# Patient Record
Sex: Male | Born: 1940 | Race: White | Hispanic: No | Marital: Married
Health system: Southern US, Community
[De-identification: ages and names within clinical notes are randomized; demographics above are authoritative.]

---

## 2007-06-01 ENCOUNTER — Inpatient Hospital Stay (HOSPITAL_COMMUNITY): Admission: RE | Admit: 2007-06-01 | Discharge: 2007-06-05 | Payer: Self-pay | Admitting: Orthopedic Surgery

## 2008-06-13 IMAGING — CR DG PORTABLE PELVIS
1 series · 1 of 1 positions shown · non-contrast
Comparison: none

CLINICAL DATA: The patient has undergone hip replacement.
 PORTABLE PELVIS ? 1 VIEW:

[view not recorded]
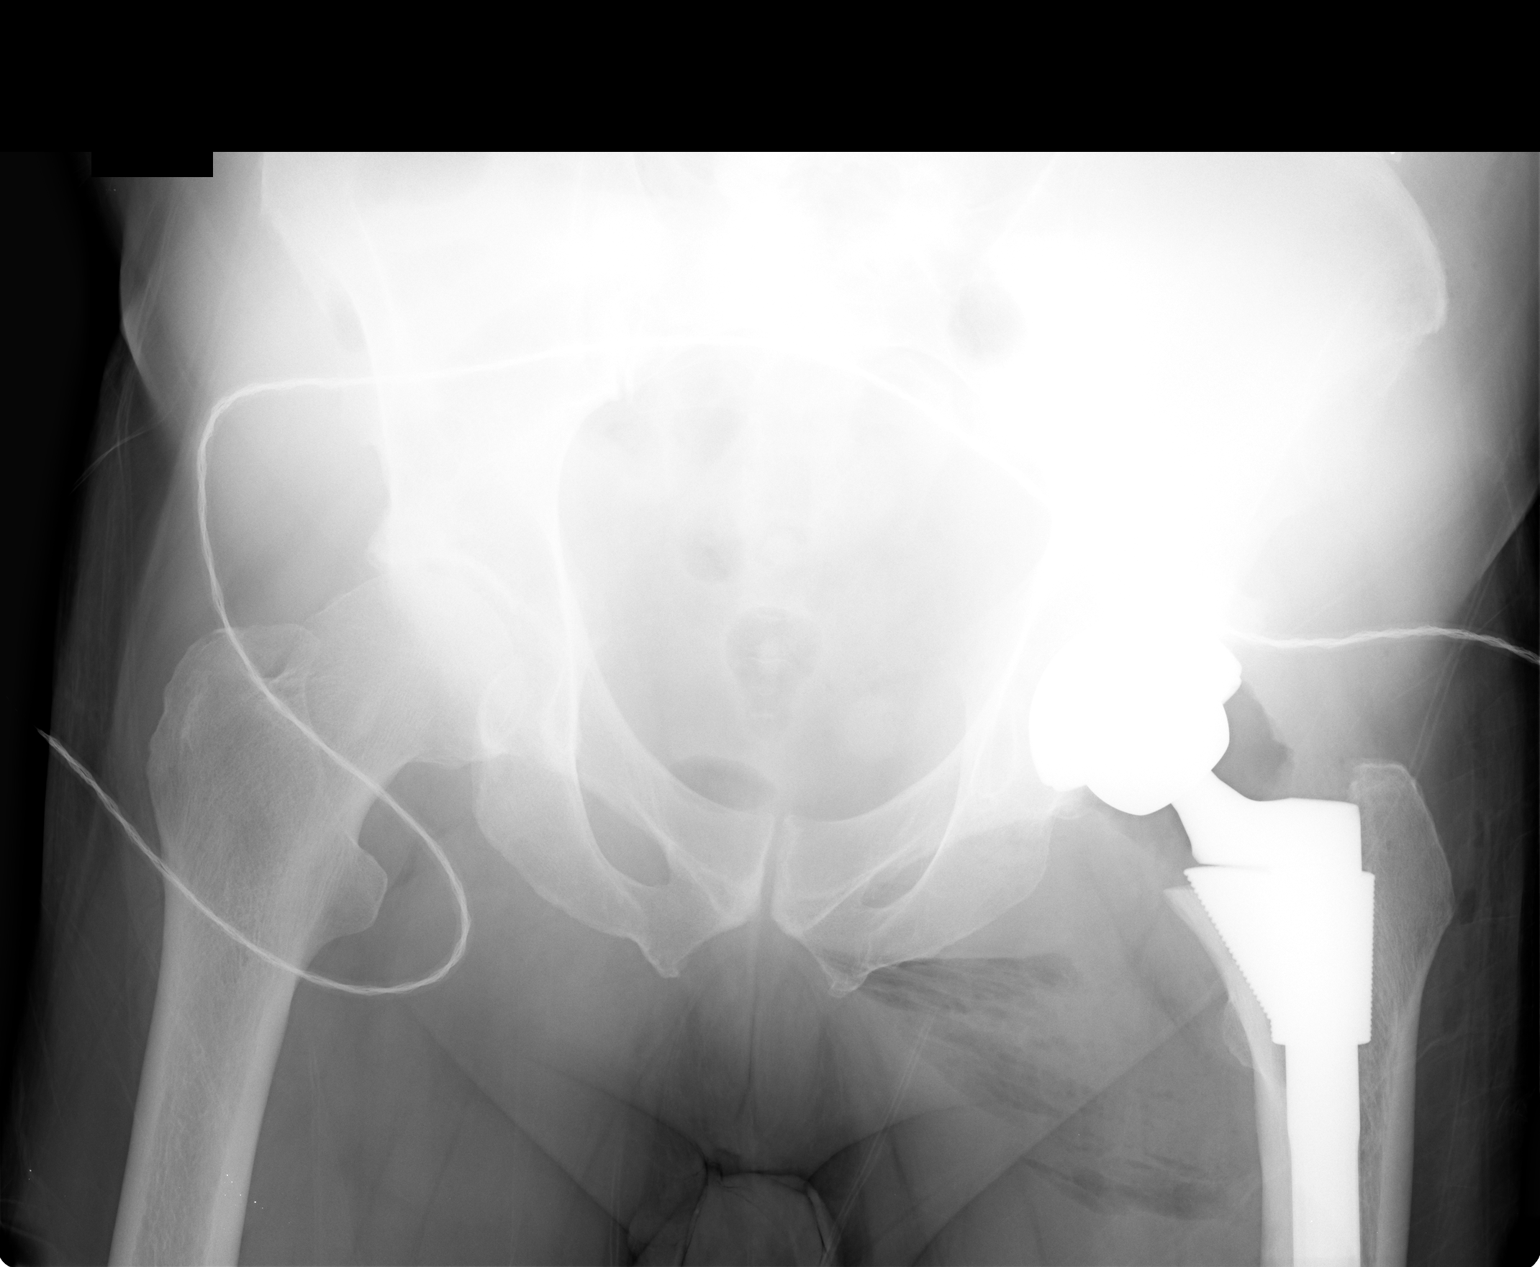

[1 of 1 positions shown; findings below may reference images not displayed]

FINDINGS: Left hip prosthesis is well positioned and has a normal postoperative appearance.  There is extensive soft tissue air within the medial left thigh and left hemipelvis.  No underlying osseous changes.  Degenerative changes seen about the right hip.
IMPRESSION: 1. Extensive soft tissue air in medial left pelvis.
 2. Total hip replacement ? normal.

## 2008-06-13 IMAGING — CR DG HIP 1V PORT*L*
1 series · 1 of 1 positions shown · non-contrast
Comparison: none

CLINICAL DATA: OA.  Hip replacement.
 PORTABLE LEFT HIP ? 1 VIEW:

[view not recorded]
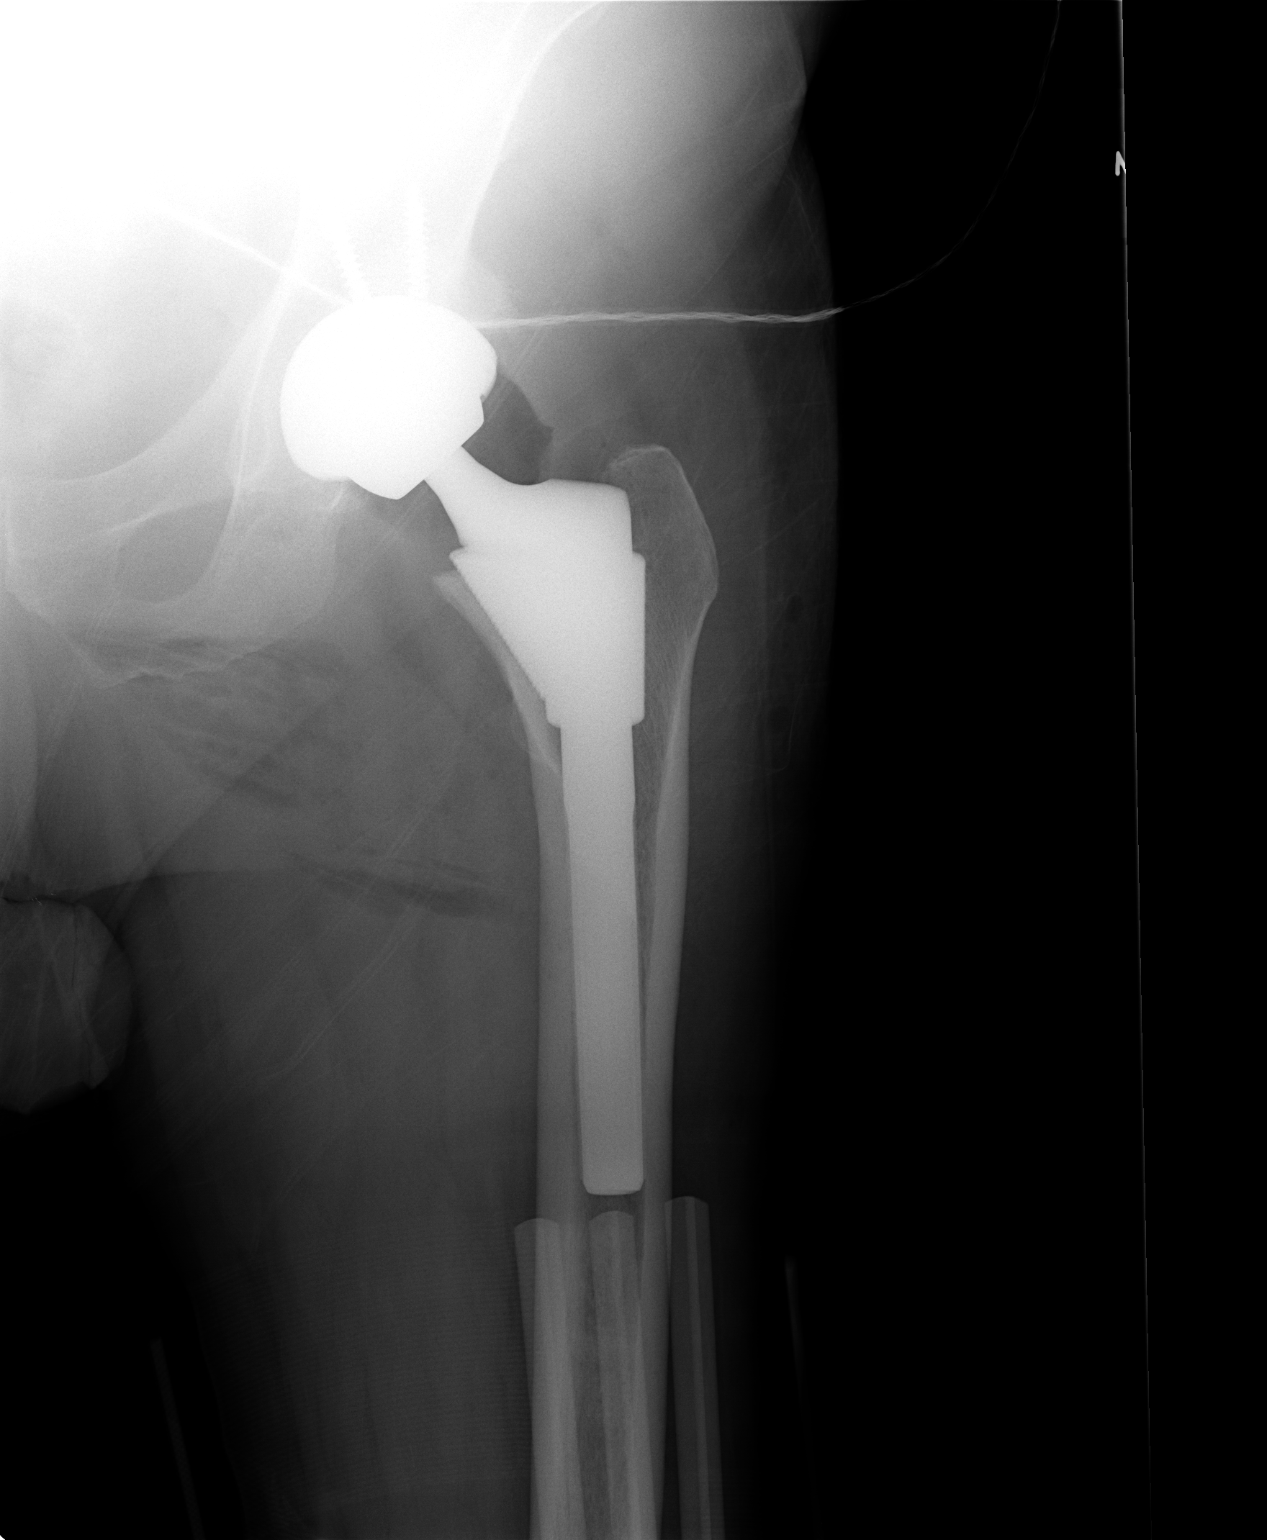

[1 of 1 positions shown; findings below may reference images not displayed]

FINDINGS: Acetabular and femoral components well positioned. Soft tissue air is seen over the medial left thigh. No perioperative complication.
IMPRESSION: Left total hip replacement normal postop appearance.

## 2009-08-22 ENCOUNTER — Encounter: Admission: RE | Admit: 2009-08-22 | Discharge: 2009-08-22 | Payer: Self-pay | Admitting: Orthopedic Surgery

## 2009-09-06 ENCOUNTER — Ambulatory Visit (HOSPITAL_BASED_OUTPATIENT_CLINIC_OR_DEPARTMENT_OTHER): Admission: RE | Admit: 2009-09-06 | Discharge: 2009-09-06 | Payer: Self-pay | Admitting: Orthopedic Surgery

## 2011-03-15 LAB — POCT I-STAT 4, (NA,K, GLUC, HGB,HCT): Glucose, Bld: 93 mg/dL (ref 70–99)

## 2011-04-23 NOTE — Op Note (Signed)
NAMESARGENT, MANKEY NO.:  [id number]   MEDICAL RECORD NO.:  [id number]          PATIENT TYPE:  INP   LOCATION:  0004                         FACILITY:  [facility]   PHYSICIAN:  Ollen Gross, M.D.    DATE OF BIRTH:  1941/11/29   DATE OF PROCEDURE:  06/01/2007  DATE OF DISCHARGE:                               OPERATIVE REPORT   PREOPERATIVE DIAGNOSIS:  Osteoarthritis left hip.   POSTOPERATIVE DIAGNOSIS:  Osteoarthritis left hip.   PROCEDURE:  Left total hip arthroplasty.   SURGEON:  Ollen Gross, M.D.   ASSISTANT:  Avel Peace PA-C.   ANESTHESIA:  General.   ESTIMATED BLOOD LOSS:  400.   DRAINS:  None.   COMPLICATIONS:  None.   CONDITION:  Stable to recovery.   CLINICAL NOTE:  Mr. Scheidt is a 70 year old male end-stage arthritis  left hip with progressively worsening pain and dysfunction.  He has  failed nonoperative management and presents for total hip arthroplasty.   PROCEDURE IN DETAIL:  After the successful administration of general  anesthetic, the patient is placed in the right lateral decubitus  position with the left side up and held with the hip positioner.  Left  lower extremity was isolated from his perineum with plastic drapes and  prepped and draped in the usual sterile fashion.  Short posterolateral  incision is made with a 10 blade through the subcutaneous tissue to the  level of the fascia lata which is incised in line with the skin  incision.  Sciatic nerve is palpated and protected and a short rotator  is isolated off the femur.  Capsulectomy is performed and the hip is  dislocated.  The center of the femoral head is marked and a trial  prosthesis placed such that the center of trial head corresponds to the  center of his native femoral head.  Osteotomy is  marked on the femoral  neck and osteotomy made with an oscillating saw.  Femoral head is  removed and the femur retracted anteriorly to gain acetabular exposure.   Acetabular  retractors are placed and the labrum and osteophytes removed.  Reaming starts at 49 mm coursing increments of 2 up to 57 mm, then a 58  mm pinnacle acetabular shell is placed in anatomic position and  transfixed with two dome screws.  The apex hole eliminator is placed and  the permanent 40 mm neutral Ultamet metal liner is placed for a metal-on-  metal hip replacement.   He was prepared with the canal finder and irrigation.  Axial reaming is  performed to 17.5 mm, proximal reaming to a 84F and the sleeve machine  to an extra extra large.  A 84F extra extra large sleeve is placed with  a 22 x 17 stem, 36 plus 12 neck about 10 degrees beyond his native  anteversion.  A 40 plus 0 head is placed and the hip reduces easily.  We  needed to go to a +6 head to get more appropriate tension.  With the +6  head we had excellent stability with full extension, full external  rotation, 70  degrees flexion, 40 degrees adduction and 70 degrees  internal rotation and 90 degrees of flexion, 70 degrees of internal  rotation.  By placing the left leg on top of the right, I felt as though  the leg lengths were equal.  The hip is then dislocated.  Trials  removed.  The permanent 56F extra extra large sleeve is placed with a 22  x 17 stem with 36 plus 12 neck 10 degrees beyond native anteversion.  The 40 plus 6 head is placed and the hips reduced to the same stability  parameters.  Wound is copiously irrigated with saline solution and short  rotators reattached to the femur through drill holes.  Fascia lata is  closed over a Hemovac drain with interrupted #1 Vicryl.  Subcu is closed  with #1 and 2-0 Vicryl and subcuticular running 4-0 Monocryl.  Incision  is cleaned and dried and Steri-Strips and bulky sterile dressing  applied.  He was then placed into a knee immobilizer, awakened and  transported to recovery in stable condition.      Ollen Gross, M.D.  Electronically Signed     FA/MEDQ  D:   06/01/2007  T:  06/01/2007  Job:  [id number]

## 2011-04-23 NOTE — Discharge Summary (Signed)
Daniel [name], Daniel [name] NO.:  [id number]   MEDICAL RECORD NO.:  [id number]          PATIENT TYPE:  INP   LOCATION:  1532                         FACILITY:  [facility]   PHYSICIAN:  Ollen Gross, M.D.    DATE OF BIRTH:  1941/01/04   DATE OF ADMISSION:  06/01/2007  DATE OF DISCHARGE:  06/05/2007                               DISCHARGE SUMMARY   ADMISSION DIAGNOSES:  1. Osteoarthritis, left hip.  2. Atrial fibrillation.  3. Hypertension.  4. History of deep vein thromboses.  5. History of pulmonary embolism.  6. Hypercholesterolemia.  7. Hiatal hernia.  8. Reflux.  9. Past history of ulcers.  10.History of renal calculi.  11.Hypothyroidism.   DISCHARGE DIAGNOSES:  1. Osteoarthritis, left hip, status post left total hip replacement      arthroplasty.  2. Mild postoperative blood loss anemia.  Did not require transfusion.  3. Mild postoperative hypotension, asymptomatic.  4..  Atrial fibrillation.  1. Hypertension.  2. History of deep vein thromboses.  3. History of pulmonary embolism.  4. Hypercholesterolemia.  5. Hiatal hernia.  6. Reflux.  7. Past history of ulcers.  8. History of renal calculi.  9. Hypothyroidism.   PROCEDURE:  June 01, 2007, left total hip.  Surgeon:  Dr. Lequita Halt.  Assistant:  Avel Peace, PA-C.  Anesthesia:  General.   CONSULTATIONS:  None.   BRIEF HISTORY:  Mr. Garraway is a 70 year old male with end-stage  arthritis of the left hip, progressive worsening pain and dysfunction,  who failed nonoperative management and now presents for a total hip  arthroplasty.   LABORATORY DATA:  Preop CBC showed a hemoglobin of 15.6, hematocrit of  46.4, a white cell count 7.7.  postop hemoglobin of 12.9, drifted down  to 12.7, last noted H&H 12.2 and 35.5.  PT/PTT on admission 24.4 and 32,  respectively, INR 2.1.  This was done preoperatively when he was on  Coumadin.  Repeated the morning of surgery and INR was down to normal  level of 1.0.   Put back on his Coumadin.  Serial pro times followed.  Last noted PT/INR 22.8 and 1.9.  Chemistry panel on admission all within  normal limits.  Serial  BMETs were followed.  Electrolytes remained within normal limits.  Preop  UA was negative.  Blood group type AB positive.   X-RAYS:  Two-view chest May 25, 2007:  Mild cardiomegaly without acute  disease.  Left hip films preop May 25, 2007:  Severe left and moderate  right hip osteoarthritis.  Postop hip and pelvis film:  Satisfactory,  normal-appearing left total hip.  EKG May 25, 2007:  atrial  fibrillation, rightward axis, nonspecific T-wave abnormalities, no  previous tracings to compare, confirmed by Dr. Arvilla Meres.   HOSPITAL COURSE:  The patient was admitted to [facility],  tolerated the procedure well, later transferred to the recovery room,  then to the orthopedic floor.  Started on PCA and p.o. analgesic for  pain control following surgery.  Started back on his Coumadin, which he  was on preoperatively.  He was also given a  Lovenox bridge protocol  until his INR became therapeutic.  He was placed on 25-50%  weightbearing.  He was actually doing pretty well on the morning of day  #1.  He did have some pain and discomfort but a lot of the deep pain,  preop pain, had improved.  Hemoglobin was 12.9.  He had very good output  from a urinary standpoint, started getting up out of bed.  By day #2 he  had had a little bit of discomfort in his left hand.  This is where his  IV was so that was discontinued.  On the evening of day one, however,  his pressure got a little low down to 97 systolic.  We held his Benicar,  which he was on daily.  Pressures were about 120 and he even had low  pressures again postop day #2, around 94 systolic when he was up and  about.  Dressing was changed on day #2, incision looked good.  We  rechecked his pressures after a little bit of fluid challenge.  His  pressures came back up in the  120s.  From a therapy standpoint he  actually did very well.  He was getting up walking, about 50-60 feet.  He continued to progress well.  He was weaned over p.o. medications.  PCA was had been in discontinued on day #1.  Progressing with his  therapy day #2 and day #3.  He had a little bit of a blister on the back  of his, leg which was likely due to the knee immobilizer which was used  for 24 hours postop.  That was covered with a little Tegaderm.  It was  okay.  By day #3 he continued to progress with therapy.  His INR was  improving but not quite there, he was 1.8.  He continued his Lovenox.  On day #3 pressures were doing a little bit better, ranging from about  94 to 129 systolic, and this was off his Benicar.  By day #4 on June 05, 2007, the patient was doing very well.  Pressure was 129/79 that  morning, no complaints, and was ready to go home.   DISCHARGE PLAN:  The patient is discharged home on June 05, 2007.   DISCHARGE DIAGNOSES:  Please see above.   DISCHARGE MEDICATIONS:  1. Coumadin protocol for DVT prophylaxis for 3 weeks but after the end      of the 3 weeks, he will go back on his own protocol.  2. Percocet 5 mg one or two every 4-6 hours as needed for pain.  3. Robaxin 500 mg p.o. q.6-8h. p.r.n. spasm.  4. He is going to resume his home medications with the exception of      his Benicar.  The Benicar is on hold at this time because of the      hypotension.  His Plavix is currently on hold.  It is of note, when      he completes the 3 weeks of Coumadin for our DVT prophylaxis, the      following day he will start back on his normal regimen of Coumadin      which was preop, and he will also resume his Plavix at that time.   FOLLOW-UP:  The patient is going to follow up in the office the week of  July 7, will call for an appointment, to follow up one day that week  with the PA, Drew.  ACTIVITY:  He is  partial weightbearing to the left lower extremity, hip   precautions, total hip protocol.  Will start some home health PT and  home health RN for Coumadin and blood draws.   FURTHER FOLLOW-UP:  Due to currently holding his Benicar at time of  discharge and also the blood pressure issues, we are going to have him  follow back up with his medical physician sometime next week, Dr. Valentina Lucks.  A copy of this discharge summary will be sent to Dr. Valentina Lucks after  transcription, fax number below.   DISPOSITION:  Home.   CONDITION ON DISCHARGE:  Improving.      Alexzandrew L. Perkins, P.A.C.      Ollen Gross, M.D.  Electronically Signed    ALP/MEDQ  D:  06/05/2007  T:  06/05/2007  Job:  [id number]   cc:   Fortunato Curling, MD  [facility]  [address]  [place], [place] [address]  Valinda Hoar [phone]

## 2011-04-26 NOTE — H&P (Signed)
NAMEOLANDER, FRIEDL NO.:  [id number]   MEDICAL RECORD NO.:  [id number]          PATIENT TYPE:  INP   LOCATION:  NA                           FACILITY:  [facility]   PHYSICIAN:  Ollen Gross, M.D.    DATE OF BIRTH:  Sep 08, 1941   DATE OF ADMISSION:  06/01/2007  DATE OF DISCHARGE:                              HISTORY & PHYSICAL   CHIEF COMPLAINT:  Left hip pain.   HISTORY OF PRESENT ILLNESS:  The patient is a 70 year old male, has been  seen by Dr. Lequita Halt for ongoing left hip pain that has been ongoing for  about a year and a half now, has progressively gotten worse with time,  started out in the left thigh and groin area, has progressively gotten  worse and interfered with his mobility.  His family is here in  [place], and he came in for a second opinion with Dr. Lequita Halt.  He  has been seen and found to have severe endstage arthritis in the left  hip with bone on bone and some erosion of the superolateral acetabulum.  It is felt he would best be served by undergoing hip replacement.  Risks  and benefits have been discussed.  He elected to proceed with surgery.   ALLERGIES:  NO KNOWN DRUG ALLERGIES.   INTOLERANCES:  MORPHINE AND CODEINE BOTH CAUSE NAUSEA.   CURRENT MEDICATIONS:  1. Lanoxin 0.25 mg.  2. Benicar 40 mg.  3. Plavix 75 mg.  4. Synthroid 75 mg.  5. Crestor 10 mg.  6. Protonix 40 mg.  7. Coumadin 7.5 mg and 5 mg alternating.   PAST MEDICAL HISTORY:  Deep vein thrombosis, pulmonary embolism,  hypercholesterolemia, hypertension, hiatal hernia, reflux disease, past  history of ulcers, history of renal calculi, history of renal calculi,  hypothyroidism, atrial fibrillation.   PAST SURGICAL HISTORY:  He has undergone an arthroscopy and also  colonoscopy.   SOCIAL HISTORY:  Married, retired, nonsmoker, no alcohol, one child.   FAMILY HISTORY:  Mother with history of arthritis, heart disease,  hypertension with a pacemaker placement, father  decreased with history  of diabetes.   REVIEW OF SYSTEMS:  GENERAL:  No fever, chills, night sweats.  NEURO:  No seizures, syncope or paralysis.  RESPIRATORY:  No shortness of  breath, productive cough or hemoptysis.  CARDIOVASCULAR:  No chest pain,  angina or orthopnea.    GI:  No nausea, vomiting, diarrhea or  constipation.  GU:  No dysuria, hematuria or discharge.  MUSCULOSKELETAL:  Left hip.   PHYSICAL EXAMINATION:  VITAL SIGNS:  Pulse is irregular, around 80.  Respirations 12, blood pressure 126/68.  GENERAL:  A 70 year old white male, well-nourished, well-developed,  slightly overweight, no acute distress, alert and oriented, cooperative,  is accompanied by his wife.  HEENT:  Normocephalic, atraumatic.  Pupils round and reactive.  Oropharynx clear.  EOMs intact.  NECK:  Supple, no bruits.  CHEST:  Clear anterior, posterior chest walls.  HEART:  Irregular rhythm.  S1, S2 noted.  No rubs, thrills,  palpitations.  ABDOMEN:  Soft, round, slightly protuberant.  Bowel sounds present.  RECTAL, BREAST, GENITALIA:  Not done, not pertinent to present illness.  EXTREMITIES:  Right hip.  Right hip shows flexion of 110, internal  rotation 30, external rotation 40, abduction of 40.  The left hip:  Less  motion at flexion of only 90, zero internal rotation, zero external  rotation of only about 10 to 20 degrees of abduction.   IMPRESSION:  1. Osteoarthritis, left hip.  2. Atrial fibrillation.  3. Hypertension.  4. History of deep venous thromboses.  5. History of pulmonary embolism.  6. Hypocholesterolemia.  7. Hiatal hernia.  8. Refluxes.  9. Past history of ulcers.  10.History of renal calculi.  11.Hypothyroidism.   PLAN:  Patient admitted to [facility] to undergo a left total  hip replacement arthroplasty.  Surgery will be performed by Dr. Ollen Gross.      Alexzandrew L. Perkins, P.A.C.      Ollen Gross, M.D.  Electronically Signed    ALP/MEDQ  D:   05/31/2007  T:  05/31/2007  Job:  [id number]   cc:   Ollen Gross, M.D.  Fax: [phone]   Sam (fax: [phone] Valentina Lucks, M.D.  [facility]  [address]  [place], [place] [address]

## 2011-08-20 ENCOUNTER — Ambulatory Visit (HOSPITAL_COMMUNITY)
Admission: RE | Admit: 2011-08-20 | Discharge: 2011-08-20 | Disposition: A | Discharge status: Home / Self Care | Payer: Medicare Other | Source: Ambulatory Visit | Attending: Orthopedic Surgery | Admitting: Orthopedic Surgery

## 2011-08-20 ENCOUNTER — Encounter (HOSPITAL_COMMUNITY): Payer: Medicare Other

## 2011-08-20 ENCOUNTER — Other Ambulatory Visit: Payer: Self-pay | Admitting: Orthopedic Surgery

## 2011-08-20 ENCOUNTER — Other Ambulatory Visit (HOSPITAL_COMMUNITY): Payer: Self-pay | Admitting: Orthopedic Surgery

## 2011-08-20 DIAGNOSIS — Z01810 Encounter for preprocedural cardiovascular examination: Secondary | ICD-10-CM | POA: Insufficient documentation

## 2011-08-20 DIAGNOSIS — M25559 Pain in unspecified hip: Secondary | ICD-10-CM | POA: Insufficient documentation

## 2011-08-20 DIAGNOSIS — M1610 Unilateral primary osteoarthritis, unspecified hip: Secondary | ICD-10-CM | POA: Insufficient documentation

## 2011-08-20 DIAGNOSIS — M1990 Unspecified osteoarthritis, unspecified site: Secondary | ICD-10-CM

## 2011-08-20 DIAGNOSIS — I517 Cardiomegaly: Secondary | ICD-10-CM | POA: Insufficient documentation

## 2011-08-20 DIAGNOSIS — Z01818 Encounter for other preprocedural examination: Secondary | ICD-10-CM | POA: Insufficient documentation

## 2011-08-20 DIAGNOSIS — M47817 Spondylosis without myelopathy or radiculopathy, lumbosacral region: Secondary | ICD-10-CM | POA: Insufficient documentation

## 2011-08-20 DIAGNOSIS — Z01812 Encounter for preprocedural laboratory examination: Secondary | ICD-10-CM | POA: Insufficient documentation

## 2011-08-20 DIAGNOSIS — M169 Osteoarthritis of hip, unspecified: Secondary | ICD-10-CM | POA: Insufficient documentation

## 2011-08-20 LAB — URINALYSIS, ROUTINE W REFLEX MICROSCOPIC
Glucose, UA: NEGATIVE mg/dL
Ketones, ur: NEGATIVE mg/dL
Leukocytes, UA: NEGATIVE
Nitrite: NEGATIVE
Specific Gravity, Urine: 1.02 (ref 1.005–1.030)
pH: 7 (ref 5.0–8.0)

## 2011-08-20 LAB — CBC
HCT: 47.3 % (ref 39.0–52.0)
MCH: 33.7 pg (ref 26.0–34.0)
MCV: 95.9 fL (ref 78.0–100.0)
Platelets: 281 10*3/uL (ref 150–400)
RBC: 4.93 MIL/uL (ref 4.22–5.81)

## 2011-08-20 LAB — COMPREHENSIVE METABOLIC PANEL
ALT: 21 U/L (ref 0–53)
AST: 19 U/L (ref 0–37)
Albumin: 4.3 g/dL (ref 3.5–5.2)
Alkaline Phosphatase: 74 U/L (ref 39–117)
CO2: 27 mEq/L (ref 19–32)
Chloride: 104 mEq/L (ref 96–112)
GFR calc non Af Amer: >60 mL/min (ref >60)
Potassium: 3.8 mEq/L (ref 3.5–5.1)
Sodium: 140 mEq/L (ref 135–145)
Total Bilirubin: 0.5 mg/dL (ref 0.3–1.2)

## 2011-08-20 LAB — APTT: aPTT: 40 seconds — ABNORMAL HIGH (ref 24–37)

## 2011-08-23 LAB — MRSA CULTURE

## 2011-08-28 ENCOUNTER — Inpatient Hospital Stay (HOSPITAL_COMMUNITY)
Admission: RE | Admit: 2011-08-28 | Discharge: 2011-09-02 | DRG: 470 | Disposition: A | Discharge status: Skilled Nursing Facility | Payer: Medicare Other | Source: Ambulatory Visit | Attending: Orthopedic Surgery | Admitting: Orthopedic Surgery

## 2011-08-28 ENCOUNTER — Inpatient Hospital Stay (HOSPITAL_COMMUNITY): Payer: Medicare Other

## 2011-08-28 DIAGNOSIS — Z8711 Personal history of peptic ulcer disease: Secondary | ICD-10-CM

## 2011-08-28 DIAGNOSIS — Z87442 Personal history of urinary calculi: Secondary | ICD-10-CM

## 2011-08-28 DIAGNOSIS — Z7902 Long term (current) use of antithrombotics/antiplatelets: Secondary | ICD-10-CM

## 2011-08-28 DIAGNOSIS — Z01810 Encounter for preprocedural cardiovascular examination: Secondary | ICD-10-CM

## 2011-08-28 DIAGNOSIS — Z96649 Presence of unspecified artificial hip joint: Secondary | ICD-10-CM

## 2011-08-28 DIAGNOSIS — M1610 Unilateral primary osteoarthritis, unspecified hip: Principal | ICD-10-CM | POA: Diagnosis present

## 2011-08-28 DIAGNOSIS — Z79899 Other long term (current) drug therapy: Secondary | ICD-10-CM

## 2011-08-28 DIAGNOSIS — K219 Gastro-esophageal reflux disease without esophagitis: Secondary | ICD-10-CM | POA: Diagnosis present

## 2011-08-28 DIAGNOSIS — E7800 Pure hypercholesterolemia, unspecified: Secondary | ICD-10-CM | POA: Diagnosis present

## 2011-08-28 DIAGNOSIS — K449 Diaphragmatic hernia without obstruction or gangrene: Secondary | ICD-10-CM | POA: Diagnosis present

## 2011-08-28 DIAGNOSIS — Z01812 Encounter for preprocedural laboratory examination: Secondary | ICD-10-CM

## 2011-08-28 DIAGNOSIS — I10 Essential (primary) hypertension: Secondary | ICD-10-CM | POA: Diagnosis present

## 2011-08-28 DIAGNOSIS — Z7901 Long term (current) use of anticoagulants: Secondary | ICD-10-CM

## 2011-08-28 DIAGNOSIS — I82509 Chronic embolism and thrombosis of unspecified deep veins of unspecified lower extremity: Secondary | ICD-10-CM | POA: Diagnosis present

## 2011-08-28 DIAGNOSIS — I2782 Chronic pulmonary embolism: Secondary | ICD-10-CM | POA: Diagnosis present

## 2011-08-28 DIAGNOSIS — M169 Osteoarthritis of hip, unspecified: Principal | ICD-10-CM | POA: Diagnosis present

## 2011-08-28 DIAGNOSIS — I4891 Unspecified atrial fibrillation: Secondary | ICD-10-CM | POA: Diagnosis present

## 2011-08-28 DIAGNOSIS — E039 Hypothyroidism, unspecified: Secondary | ICD-10-CM | POA: Diagnosis present

## 2011-08-28 LAB — TYPE AND SCREEN: Antibody Screen: NEGATIVE

## 2011-08-28 LAB — APTT: aPTT: 30 seconds (ref 24–37)

## 2011-08-28 LAB — PROTIME-INR
INR: 1.1 (ref 0.00–1.49)
Prothrombin Time: 14.4 seconds (ref 11.6–15.2)

## 2011-08-29 LAB — CBC
Hemoglobin: 13.4 g/dL (ref 13.0–17.0)
MCH: 33.3 pg (ref 26.0–34.0)
MCHC: 34.3 g/dL (ref 30.0–36.0)
RDW: 12.5 % (ref 11.5–15.5)

## 2011-08-29 LAB — BASIC METABOLIC PANEL
BUN: 9 mg/dL (ref 6–23)
CO2: 27 mEq/L (ref 19–32)
Calcium: 9 mg/dL (ref 8.4–10.5)
Chloride: 102 mEq/L (ref 96–112)
Creatinine, Ser: 0.66 mg/dL (ref 0.50–1.35)

## 2011-08-29 LAB — PROTIME-INR
INR: 1.24 (ref 0.00–1.49)
Prothrombin Time: 15.9 seconds — ABNORMAL HIGH (ref 11.6–15.2)

## 2011-08-30 LAB — CBC
HCT: 39.8 % (ref 39.0–52.0)
MCHC: 34.7 g/dL (ref 30.0–36.0)
MCV: 96.4 fL (ref 78.0–100.0)
Platelets: 230 10*3/uL (ref 150–400)
RDW: 12.5 % (ref 11.5–15.5)

## 2011-08-30 LAB — BASIC METABOLIC PANEL
CO2: 27 mEq/L (ref 19–32)
Calcium: 9.3 mg/dL (ref 8.4–10.5)
Chloride: 101 mEq/L (ref 96–112)
Potassium: 4.7 mEq/L (ref 3.5–5.1)
Sodium: 135 mEq/L (ref 135–145)

## 2011-08-31 LAB — CBC
HCT: 38.3 % — ABNORMAL LOW (ref 39.0–52.0)
MCV: 96.5 fL (ref 78.0–100.0)
Platelets: 226 10*3/uL (ref 150–400)
RBC: 3.97 MIL/uL — ABNORMAL LOW (ref 4.22–5.81)
WBC: 12.5 10*3/uL — ABNORMAL HIGH (ref 4.0–10.5)

## 2011-09-01 LAB — PROTIME-INR
INR: 1.5 — ABNORMAL HIGH (ref 0.00–1.49)
Prothrombin Time: 18.4 seconds — ABNORMAL HIGH (ref 11.6–15.2)

## 2011-09-04 NOTE — Op Note (Signed)
NAMEKELLIN, [name] NO.:  [id number]  MEDICAL RECORD NO.:  [id number]  LOCATION:  0007                         FACILITY:  [facility]  PHYSICIAN:  Ollen Gross, M.D.    DATE OF BIRTH:  07/16/1941  DATE OF PROCEDURE:  08/28/2011 DATE OF DISCHARGE:                              OPERATIVE REPORT   PREOPERATIVE DIAGNOSIS:  Osteoarthritis, right hip.  POSTOPERATIVE DIAGNOSIS:  Osteoarthritis, right hip.  PROCEDURE:  Right total hip arthroplasty.  SURGEON:  Ollen Gross, MD  ASSISTANT:  Alexzandrew L. Julien Girt, PA-C  ANESTHESIA:  General.  ESTIMATED BLOOD LOSS:  700.  DRAIN:  Hemovac x1.  COMPLICATIONS:  None.  CONDITION:  Stable to Recovery.  BRIEF CLINICAL NOTE:  [name] is a 70 year old male with advanced end- stage arthritis of his right hip with progressively worsening pain and dysfunction.  He has had a previous successful left total hip arthroplasty.  He has gone on to develop bone on bone arthritis of his right hip with severe pain and loss of function.  He presents now for right total hip arthroplasty.  PROCEDURE IN DETAIL:  After successful administration of general anesthetic, the patient was placed in the left lateral decubitus position with the right side up, and held with the hip positioner. Right lower extremity was isolated from his perineum with plastic drapes and prepped and draped in usual sterile fashion.  Short posterolateral incision was made with a 10 blade through subcutaneous tissue to the level of the fascia lata which was incised in line with the skin incision.  The sciatic nerve was palpated and protected and short rotators and capsule were isolated off the femur.  Hip was dislocated. Center of femoral head was marked and a trial prosthesis placed such that the center of the trial head corresponds to the center of his native femoral head.  Osteotomy line was marked on the femoral neck and osteotomy made with an oscillating  saw.  Femoral head was removed. Retractors were placed around the proximal femur for access to the femoral canal.  The canal finder was passed into the femoral canal and the canal was thoroughly irrigated saline to remove fatty contents.  Axial reaming was performed up to 17.5 mm proximal reaming to 22 after the sleeve machined to a large.  A 74F large trial sleeve was placed.  Sponge was packed into the femoral canal to allow for hemostasis.  The femur was retracted anteriorly to gain acetabular exposure. Acetabular retractors were placed and labrum and osteophytes removed. Acetabular reaming was performed up to 57 mm and a 58 mm Pinnacle acetabular shell was placed in anatomic position.  There was outstanding purchase and I did not place any additional dome screws.  The apex hole eliminator was placed and then the 36 mm neutral +4 Marathon liner was placed.  We then placed a trial stem which is a 22 x 17 with a 36 +12 neck about 10 degrees beyond native anteversion.  The 36 +3 head was placed.  Hip was reduced and soft tissue tension required placement of a 36 +6 head.  With the 36 +6 head, the hip was reduced with outstanding soft-tissue tension and  outstanding stability.  There was full extension, full external rotation, 70 degrees flexion, 40 degrees adduction, 90 degrees internal rotation, 90 degrees of flexion, and 70 degrees of internal rotation.  By placing the right leg on top of the left, I felt as though the leg lengths were equal.  The hip was then dislocated and all trials were removed.  The permanent 70F large sleeve was placed with a 22 x 17 stem and 36 +12 neck about 10 degrees beyond native anteversion.  The 36 +6 ceramic head was placed.  The hip was reduced with the same stability parameters.  The wound was copiously irrigated with saline solution and the short rotators and capsule reattached to the femur through drill holes using Ethibond suture. Fascia lata was  closed over Hemovac drain with interrupted #1 Vicryl.  A mixture of 20 cc of Exparel and 50 cc of saline was then injected into the fascia lata, gluteal muscles, and subcutaneous tissues. Subcutaneous was closed with interrupted #1 and interrupted 2-0 Vicryl and then subcuticular with running 4-0 Monocryl.  The drain was hooked to suction.  Incision cleaned and dried and Steri-Strips and a bulky sterile dressing applied.  The patient was then placed into a knee immobilizer, awakened, and transported to Recovery in stable condition.  Please note that it was a medical necessity to have a surgical assistant for this procedure to perform in a safe and expeditious manner. Surgical assistance was necessary for retraction of vital neurovascular structures and for positioning of the hip in a manner in which the prosthesis will be placed with appropriate configuration and alignment.     Ollen Gross, M.D.     FA/MEDQ  D:  08/28/2011  T:  08/28/2011  Job:  [id number]  Electronically Signed by Ollen Gross M.D. on 09/04/2011 10:33:48 AM

## 2011-09-18 NOTE — Discharge Summary (Signed)
Daniel [name], [name] NO.:  [id number]  MEDICAL RECORD NO.:  [id number]  LOCATION:  1621                         FACILITY:  [facility]  PHYSICIAN:  Ollen Gross, M.D.    DATE OF BIRTH:  [birth year]  DATE OF ADMISSION:  08/28/2011 DATE OF DISCHARGE:  09/02/2011                        DISCHARGE SUMMARY - REFERRING   ADMITTING DIAGNOSES: 1. Right hip osteoarthritis. 2. Cataracts. 3. Hypertension. 4. Hypercholesterolemia. 5. History of atrial fibrillation. 6. Gastroesophageal reflux disease. 7. Hiatal hernia. 8. Past history of ulcers. 9. History of renal calculi. 10.Hypothyroidism. 11.Past history of deep vein thrombosis. 12.Past history of pulmonary embolism.  DISCHARGE DIAGNOSES: 1. Osteoarthritis right hip, status post right total hip replacement     arthroplasty. 2. Cataracts. 3. Hypertension. 4. Hypercholesterolemia. 5. History of atrial fibrillation. 6. Gastroesophageal reflux disease. 7. Hiatal hernia. 8. Past history of ulcers. 9. History of renal calculi. 10.Hypothyroidism. 11.Past history of deep vein thrombosis. 12.Past history of pulmonary embolism.  PROCEDURE:  August 28, 2011, right total hip.  Surgeon, Dr. Lequita Halt. Assistant, Patrica Duel, P.A.C.  Anesthesia, general.  Blood loss about 700 cc.  CONSULTS:  None.  BRIEF HISTORY:  The patient is a 70 year old male known history of osteoarthritis of the right hip that has progressively gotten worse.  He is known to have end-stage with bone-on-bone changes.  He has failed nonoperative management and is now was felt he would benefit undergoing surgical intervention.  He has previously undergone a left total hip and doing well with that.  LABORATORY DATA:  CBC on admission hemoglobin of 16.6, hematocrit 47.3, white cell count 8, platelets 281.  PT/INR 26.4 and 2.38 on Coumadin that was stopped preoperatively and INR on the date of surgery was 1.1. Chem panel preop all within  normal limits.  Preop UA negative.  Blood group type AB positive.  Nasal swabs were negative, staph aureus negative for MRSA.  Serial CBCs were followed.  Hemoglobin dropped down to 13.4 and then 13.8.  Last hemoglobin and hematocrit 13.1 and 38.3. Serial pro-times followed per Coumadin protocol.  Last PT/INR 18.5 and 1.51.  Serial BMETs followed for 48 hours.  Electrolytes all remained within normal limits.  X-rays two-view chest dated August 20, 2011, stable exam.  No active disease.  EKG dated August 20, 2011 atrial fibrillation, right ward axis, low- voltage QRS, no significant change since last tracing confirmed by Dr. Lewayne Bunting.  HOSPITAL COURSE:  The patient was admitted to [facility] taken to OR, underwent above-stated procedure without complication.  The patient tolerated the procedure well, later transferred to the recovery room orthopedic floor, started on p.o. and IV analgesic pain control following surgery, given 24 hours postop IV antibiotics, was started back on his home meds.  With the exception of his Plavix, he was on Coumadin protocol with the Lovenox bridge due to his history of DVT and PE.  He had excellent urinary output on day #1 and wanted to look into [facility], so we got social worker involved right away. He started getting up out of bed on day #1, taken a few steps.  By day #2, he was actually doing fairly well, walking about 20 feet.  We changed his dressing on day #2, incision looked good.  No signs of infection, healing well.  Since he was going to a skilled facility, he had remained in the hospital on Saturday and Sunday.  He was seen by the coverage weekend crew, continued to progress well and received physical therapy at occupational therapy through the weekend with daily dressing changes.  His INR was followed closely.  It started out at 1.1 and got up to 1.53 over the weekend.  He continued to progress well.  He  was seen back on Monday morning by Dr. Lequita Halt, who felt that the patient was doing well, stable, and felt that the bed should be available today. Once the bed was available, we will transfer him at that time.  DISCHARGE/PLAN: 1. The patient will be transferred to a [facility]     skilled facility today on September 02, 2011. 2. Discharge diagnoses, please see above. 3. Discharge medications at the time of transfer include:     a.     Lovenox 40 mg daily.  Continue the Lovenox until the INR is      therapeutic of 2.0 or greater.     b.     Coumadin protocol.  Please saturate the Coumadin level for a      target INR between 2.0 and 3.0 needs to be on our Coumadin      protocol for 3 weeks, then at the end of the 3 weeks he may resume      his normal Coumadin dosage at home, (please note the Plavix is on      hold right now during the 3 weeks of tight Coumadin protocol.      Once he has stopped the tight Coumadin protocol for 3 weeks, then      resume his home Coumadin dose, he may also resume his Plavix but      again that is 3 weeks from date of surgery).     c.     Colace 100 mg p.o. b.i.d.     d.     Protonix 40 mg every evening.     e.     Crestor 10 mg every evening.     f.     Levothyroxine 112 mcg p.o. daily.     g.     Digoxin 0.25 mg every morning.     h.     Lisinopril 40 mg every evening.     i.     Tylenol 325 one or two every 4-6 hours as needed for mild      pain, temperature or headache.     j.     Laxative of choice.     k.     Enema of choice.     l.     Dilaudid 2 mg tablets 1 or 2 every 4-6 hours as needed for      moderate pain.     m.     Robaxin 500 mg 1 every 6-8 hours p.r.n. spasm.     n.     Restoril 15 mg 1-2 tablets at hour of sleep p.r.n. insomnia.     o.     Ultram 50 mg 1-2 tablets every 6 hours as needed for mild to      moderate pain. 4. Diet.  Heart-healthy diet. 5. Activity.  He is partial weightbearing at 25%-50% to the right      lower extremity, hip precautions, and total  hip protocol need to be     followed.  PT and OT for gait training, ambulation, ADLs, range of     motion, and strengthening exercises.  Please note he may start     showering, however, do not submerge the incision under water.  Once     he is discharged from [facility], please help arrange his home     health physical therapy prior to being discharge. 6. Followup.  He needs to follow up with Dr. Lequita Halt at the office     approximately 2 weeks from the date of surgery to help the patient.     Please contact the office at [phone] to help arrange appointment     and follow up care of this patient approximately 2 weeks out.  DISPOSITION:  [facility]  CONDITION ON DISCHARGE:  Improving.     Alexzandrew L. Julien Girt, P.A.C.   ______________________________ Ollen Gross, M.D.    ALP/MEDQ  D:  09/02/2011  T:  09/02/2011  Job:  [id number]  cc:   Dr. Karle Starch  Dr. Fortunato Curling  Electronically Signed by Patrica Duel P.A.C. on 09/05/2011 11:18:43 AM Electronically Signed by Ollen Gross M.D. on 09/18/2011 11:16:32 AM

## 2011-09-18 NOTE — H&P (Signed)
NAMERA, PFIESTER NO.:  [id number]  MEDICAL RECORD NO.:  [id number]  LOCATION:  1621                         FACILITY:  [facility]  PHYSICIAN:  Ollen Gross, M.D.    DATE OF BIRTH:  1941-06-13  DATE OF ADMISSION:  08/28/2011 DATE OF DISCHARGE:                             HISTORY & PHYSICAL   CHIEF COMPLAINT:  Right hip pain.  HISTORY OF PRESENT ILLNESS:  The patient is 70 year old male well-known to Dr. Ollen Gross, have previous undergone a left total hip, is doing well with his left hip and the right hip continues to be problematic , it has been hurting for several months now, it has been progressive in nature.  He has known end-stage arthritis with bone-on-bone changes.  He has failed nonoperative management and would benefit from undergoing surgical intervention on the opposite side.  ALLERGIES:  MORPHINE and CODEINE, both cause nausea.  CURRENT MEDICATIONS: 1. Digoxin 0.25 mg daily. 2. Plavix 75 mg daily. 3. Synthroid 112 mcg daily. 4. Crestor 10 mg daily. 5. Vitamin D 350,000 international units twice a week. 6. Lisinopril 40 mg in the evening. 7. Pantoprazole 40 mg daily. 8. Coumadin 7.5 mg on Sunday, Monday, Wednesday, Friday; 3.75 mg on     Tuesday, Thursday, Saturday.  PAST MEDICAL HISTORY: 1. Cataracts. 2. Hypertension. 3. Hypercholesterolemia. 4. Atrial fibrillation. 5. Gastroesophageal reflux disease. 6. Hiatal hernia. 7. Past history of ulcers. 8. History of renal calculi. 9. Hypothyroidism. 10.History of deep vein thrombosis. 11.History of pulmonary embolism.  PAST SURGICAL HISTORY:  Left total hip replacement on June 01 2007; left knee arthroscopy on September 06, 2009; gallbladder surgery on January 23, 2009; carpal tunnel surgery on the right on May 14, 2010; carpal tunnel left hand surgery on March 26, 2011; cataract surgery left eye on October 10, 2010.  FAMILY HISTORY:  Father with history of congestive heart  failure. Mother currently in assisted living, has two sisters and a brother.  SOCIAL HISTORY:  Married, retired, nonsmoker, nonalcohol, has one daughter.  The patient does want to look into [facility] postoperatively.  REVIEW OF SYSTEMS:  GENERAL:  No fevers, chills, night sweats. NEUROLOGIC:  No seizures, syncope, or paralysis.  RESPIRATORY: Shortness breath, cough or hemoptysis.  CARDIOVASCULAR:  Chest pain, angina, or orthopnea.  GI:  No nausea, vomiting, or diarrhea.  Does have some reflux heartburn.  GU: A little bit of frequency, nocturia, some slight incontinence.  No dysuria or hematuria.  MUSCULOSKELETAL:  Hip pain.  PHYSICAL EXAMINATION:  VITAL SIGNS:  Pulse 76, which is irregular with atrial fib, respirations 12, blood pressure 126/78. GENERAL:  A 70 year old white male, well-nourished, well-developed, tall frame, slightly overweight, alert and cooperative, good historian. HEENT:  Normocephalic, atraumatic.  Pupils are equal round and reactive. EOMs intact. NECK:  Supple.  No carotid bruits. CHEST:  Clear. HEART:  Irregular rhythm noted.  S1, S2 noted.  No murmurs. ABDOMEN:  Soft, round protuberant.  Bowel sounds present. RECTAL, BREASTS, AND GENITALIA:  Not done, not pertinent to present illness. EXTREMITIES:  Right hip flexion 90, zero internal rotation, 10 degrees external rotation with 20 degrees abduction.  IMPRESSION:  Osteoarthritis, right hip.  PLAN:  The patient admitted to [facility] undergo right total hip replacement arthroplasty.  Surgery will be performed by Ollen Gross.  He has been seen preoperatively by Dr. Rush Barer and felt to be stable for surgery.  Recommended stopping his Coumadin a week prior to surgery.  Also been seen by Dr. Fortunato Curling . Preoperatively, we recommended Lovenox bridging preoperatively and recommended 2 days after Coumadin in the hospital at which, actually he will be on a Lovenox bridge  in the hospital until his INR is back therapeutic.  The patient will be admitted to [facility] to undergo procedure per Dr. Lequita Halt.     Alexzandrew L. Julien Girt, P.A.C.   ______________________________ Ollen Gross, M.D.    ALP/MEDQ  D:  08/29/2011  T:  08/29/2011  Job:  [id number]  cc:   Dr. Karle Starch [facility], [address], [place]  Dr. [facility], [facility] [address] [place] [address]  Electronically Signed by Patrica Duel P.A.C. on 09/05/2011 11:18:36 AM Electronically Signed by Ollen Gross M.D. on 09/18/2011 11:16:35 AM

## 2011-09-25 LAB — CBC
HCT: 35.5 — ABNORMAL LOW
HCT: 37.1 — ABNORMAL LOW
HCT: 37.1 — ABNORMAL LOW
HCT: 46.4
Hemoglobin: 12.7 — ABNORMAL LOW
MCHC: 33.5
MCHC: 34.3
MCHC: 34.4
MCHC: 34.6
MCV: 97.5
MCV: 97.9
MCV: 98.6
MCV: 99.2
Platelets: 236
Platelets: 239
Platelets: 296
RBC: 3.8 — ABNORMAL LOW
RDW: 12.5
RDW: 12.5
RDW: 12.8

## 2011-09-25 LAB — URINALYSIS, ROUTINE W REFLEX MICROSCOPIC
Glucose, UA: NEGATIVE
Hgb urine dipstick: NEGATIVE
Ketones, ur: NEGATIVE
Protein, ur: NEGATIVE

## 2011-09-25 LAB — BASIC METABOLIC PANEL
BUN: 4 — ABNORMAL LOW
BUN: 6
CO2: 28
CO2: 30
Chloride: 100
Chloride: 103
Creatinine, Ser: 0.73
GFR calc Af Amer: >60
GFR calc non Af Amer: >60
Glucose, Bld: 119 — ABNORMAL HIGH
Potassium: 4
Potassium: 4.1
Sodium: 135

## 2011-09-25 LAB — CROSSMATCH: Antibody Screen: NEGATIVE

## 2011-09-25 LAB — PROTIME-INR
INR: 1.9 — ABNORMAL HIGH
INR: 2.1 — ABNORMAL HIGH
Prothrombin Time: 13.7
Prothrombin Time: 16.8 — ABNORMAL HIGH
Prothrombin Time: 21.7 — ABNORMAL HIGH

## 2011-09-25 LAB — APTT: aPTT: 32

## 2011-09-25 LAB — COMPREHENSIVE METABOLIC PANEL
Albumin: 4.2
BUN: 7
Calcium: 9.7
Creatinine, Ser: 0.7
Total Protein: 7.2

## 2012-09-01 IMAGING — CR DG HIP COMPLETE 2+V*R*
3 series · 3 of 3 positions shown · non-contrast
Comparison: None.

CLINICAL DATA: Right hip pain.  Hip osteoarthritis.

RIGHT HIP - COMPLETE 2+ VIEW

[t pelvis a.p.]
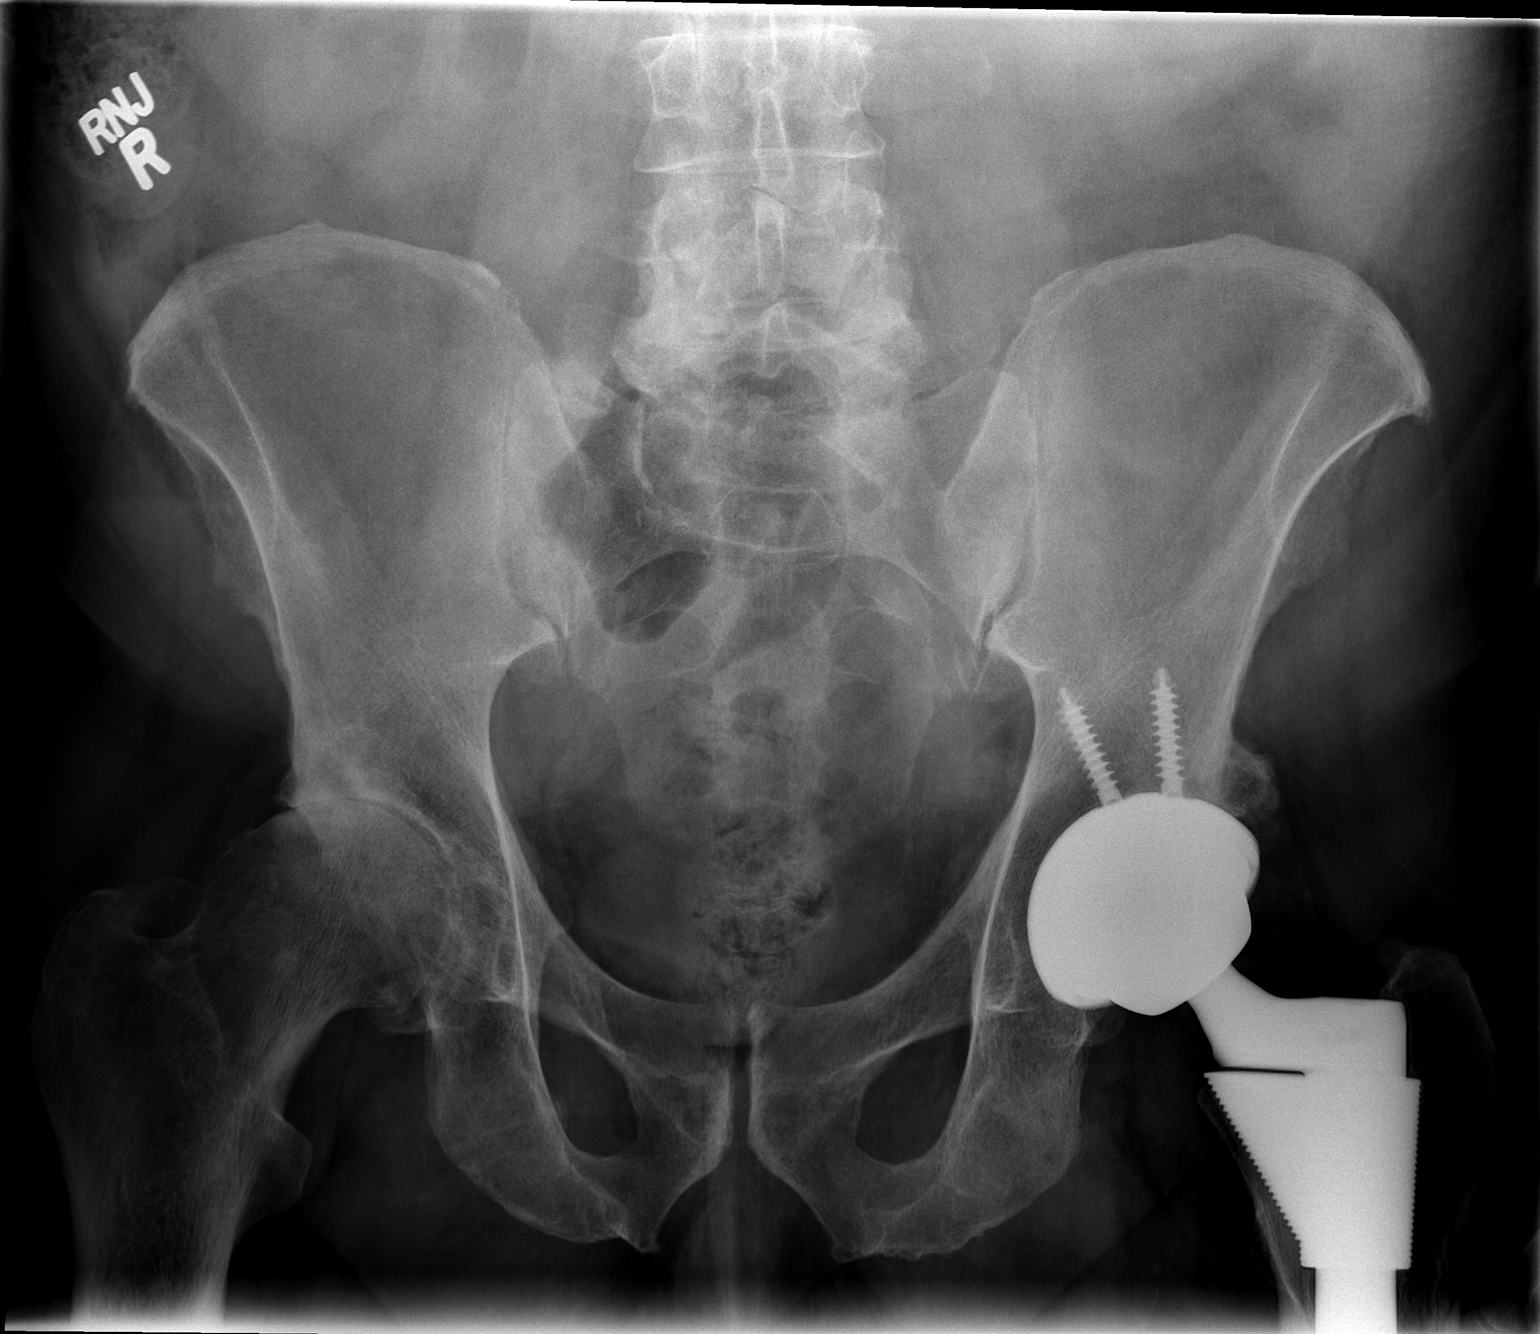

[t hip ap right]
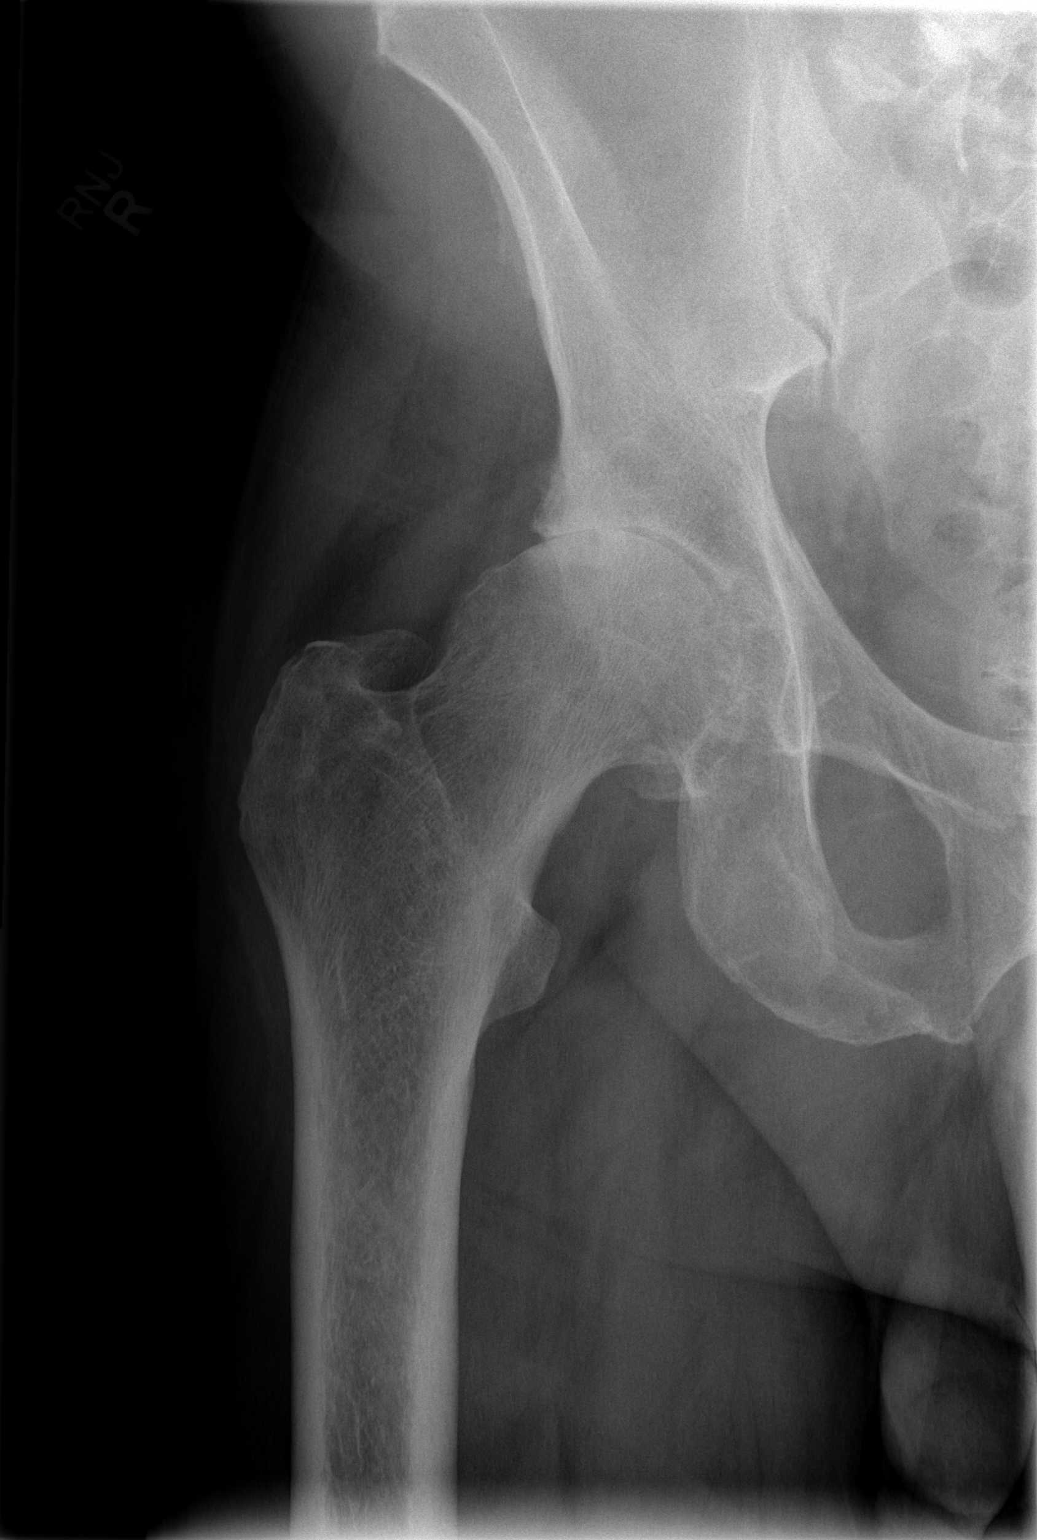

[t hip frog leg right]
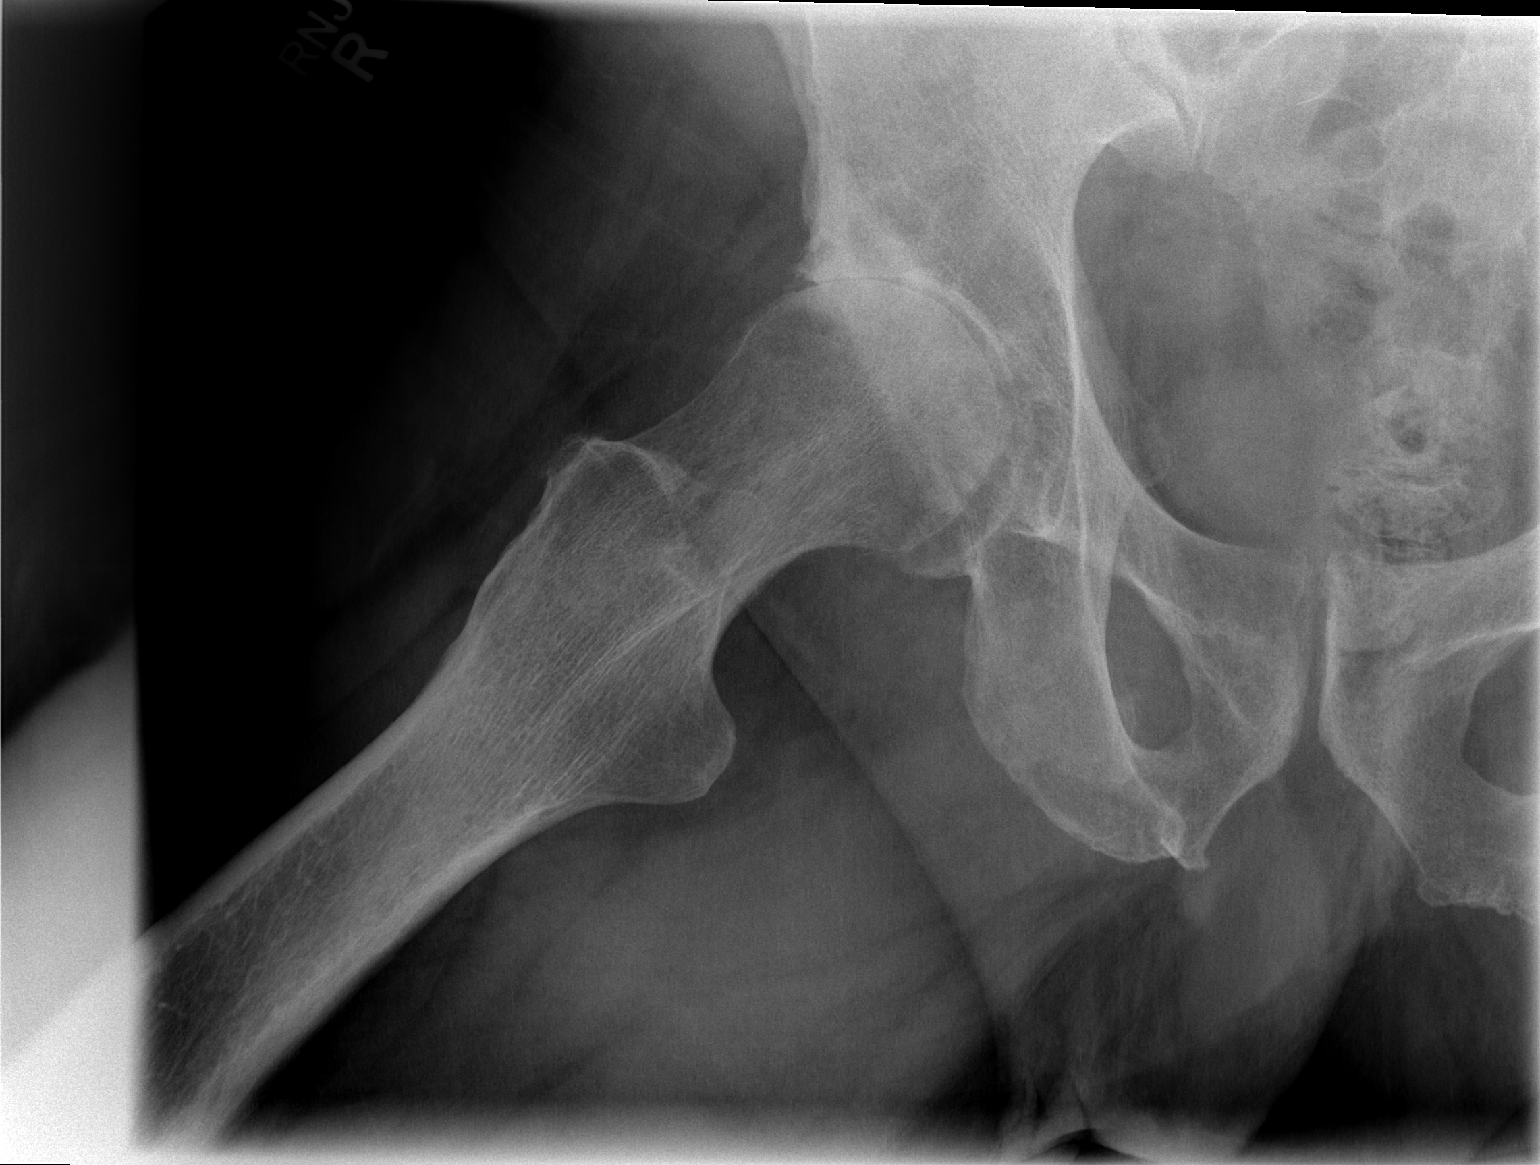

[3 of 3 positions shown; findings below may reference images not displayed]

FINDINGS: Severe right hip osteoarthritis is seen with complete
loss of superior joint space.  Subchondral sclerosis and lucency
also seen involving the acetabular roof and superior articular
surface of the femoral head.

Total left hip arthroplasty is seen.  No other pelvic bone
abnormality identified.  Lower lumbar spine degenerative changes
are noted.
IMPRESSION: 1.  Severe right hip osteoarthritis.
2.  Lower lumbar spine degenerative changes.

## 2012-09-09 IMAGING — CR DG PORTABLE PELVIS
1 series · 1 of 1 positions shown · non-contrast
Comparison: Dedicated right hip films from the same day.

CLINICAL DATA: Status post right total hip arthroplasty.

PORTABLE PELVIS

[AP]
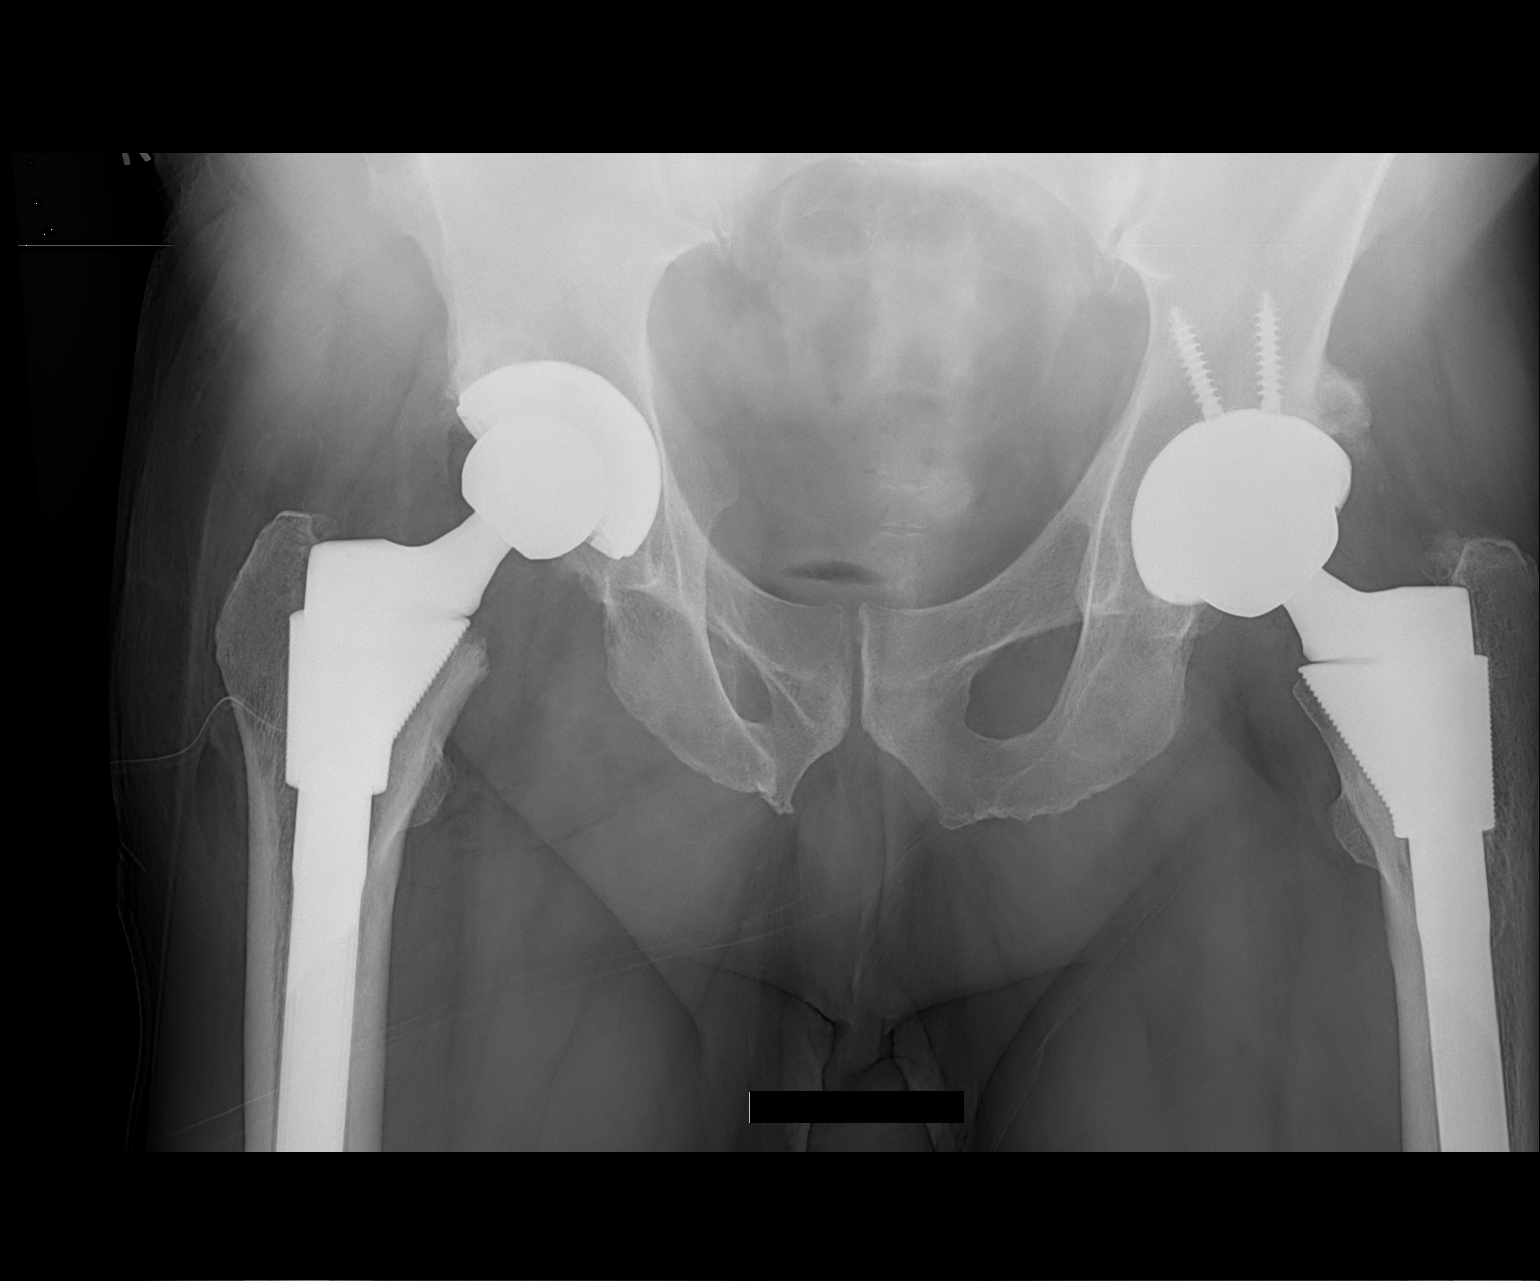

[1 of 1 positions shown; findings below may reference images not displayed]

FINDINGS: The patient is now status post right total hip
arthroplasty.  The previous left prosthesis is stable.  The tip the
left prosthesis is not visualized.  A surgical drain is is in place
over the right hip.  Femoral head projects over the acetabular cup.
IMPRESSION: Status post right total hip arthroplasty without radiographic
evidence for complication.

## 2014-02-18 ENCOUNTER — Other Ambulatory Visit: Payer: Self-pay | Admitting: *Deleted

## 2014-02-18 ENCOUNTER — Other Ambulatory Visit: Payer: Self-pay | Admitting: Internal Medicine

## 2014-02-18 DIAGNOSIS — M5430 Sciatica, unspecified side: Secondary | ICD-10-CM

## 2014-02-26 ENCOUNTER — Ambulatory Visit
Admission: RE | Admit: 2014-02-26 | Discharge: 2014-02-26 | Disposition: A | Discharge status: Home / Self Care | Payer: Medicare Other | Source: Ambulatory Visit | Attending: *Deleted | Admitting: *Deleted

## 2014-02-26 DIAGNOSIS — M5430 Sciatica, unspecified side: Secondary | ICD-10-CM
